# Patient Record
Sex: Female | Born: 1955 | Race: Black or African American | Hispanic: No | Marital: Single
Health system: Southern US, Community
[De-identification: ages and names within clinical notes are randomized; demographics above are authoritative.]

---

## 1999-03-12 ENCOUNTER — Other Ambulatory Visit: Admission: RE | Admit: 1999-03-12 | Discharge: 1999-03-12 | Payer: Self-pay | Admitting: Family Medicine

## 2000-02-08 ENCOUNTER — Encounter: Admission: RE | Admit: 2000-02-08 | Discharge: 2000-02-08 | Payer: Self-pay | Admitting: Family Medicine

## 2000-02-08 ENCOUNTER — Encounter: Payer: Self-pay | Admitting: Family Medicine

## 2000-04-05 ENCOUNTER — Other Ambulatory Visit: Admission: RE | Admit: 2000-04-05 | Discharge: 2000-04-05 | Payer: Self-pay | Admitting: Family Medicine

## 2001-02-08 ENCOUNTER — Encounter: Admission: RE | Admit: 2001-02-08 | Discharge: 2001-02-08 | Payer: Self-pay | Admitting: Family Medicine

## 2001-02-08 ENCOUNTER — Encounter: Payer: Self-pay | Admitting: Family Medicine

## 2001-04-06 ENCOUNTER — Other Ambulatory Visit: Admission: RE | Admit: 2001-04-06 | Discharge: 2001-04-06 | Payer: Self-pay | Admitting: Family Medicine

## 2002-02-13 ENCOUNTER — Encounter: Payer: Self-pay | Admitting: Family Medicine

## 2002-02-13 ENCOUNTER — Encounter: Admission: RE | Admit: 2002-02-13 | Discharge: 2002-02-13 | Payer: Self-pay | Admitting: Family Medicine

## 2003-02-18 ENCOUNTER — Encounter: Admission: RE | Admit: 2003-02-18 | Discharge: 2003-02-18 | Payer: Self-pay | Admitting: Family Medicine

## 2003-02-18 ENCOUNTER — Encounter: Payer: Self-pay | Admitting: Family Medicine

## 2004-03-30 ENCOUNTER — Encounter: Admission: RE | Admit: 2004-03-30 | Discharge: 2004-03-30 | Payer: Self-pay | Admitting: Family Medicine

## 2004-04-26 ENCOUNTER — Other Ambulatory Visit: Admission: RE | Admit: 2004-04-26 | Discharge: 2004-04-26 | Payer: Self-pay | Admitting: Family Medicine

## 2005-01-07 ENCOUNTER — Encounter: Admission: RE | Admit: 2005-01-07 | Discharge: 2005-01-07 | Payer: Self-pay | Admitting: Allergy and Immunology

## 2005-05-02 ENCOUNTER — Other Ambulatory Visit: Admission: RE | Admit: 2005-05-02 | Discharge: 2005-05-02 | Payer: Self-pay | Admitting: Family Medicine

## 2005-05-03 ENCOUNTER — Ambulatory Visit (HOSPITAL_COMMUNITY): Admission: RE | Admit: 2005-05-03 | Discharge: 2005-05-03 | Payer: Self-pay | Admitting: Allergy and Immunology

## 2005-05-03 ENCOUNTER — Encounter: Admission: RE | Admit: 2005-05-03 | Discharge: 2005-05-03 | Payer: Self-pay | Admitting: Family Medicine

## 2005-07-20 ENCOUNTER — Ambulatory Visit: Payer: Self-pay | Admitting: Critical Care Medicine

## 2005-07-25 ENCOUNTER — Ambulatory Visit: Payer: Self-pay | Admitting: Cardiology

## 2005-08-25 ENCOUNTER — Ambulatory Visit: Payer: Self-pay | Admitting: Critical Care Medicine

## 2005-11-06 IMAGING — CR DG CHEST 2V
2 series · 2 of 2 positions shown · non-contrast
Comparison: none

CLINICAL DATA: Cough.  
 DIAGNOSTIC CHEST ? TWO VIEWS:
 No comparison.

[w chest pa]
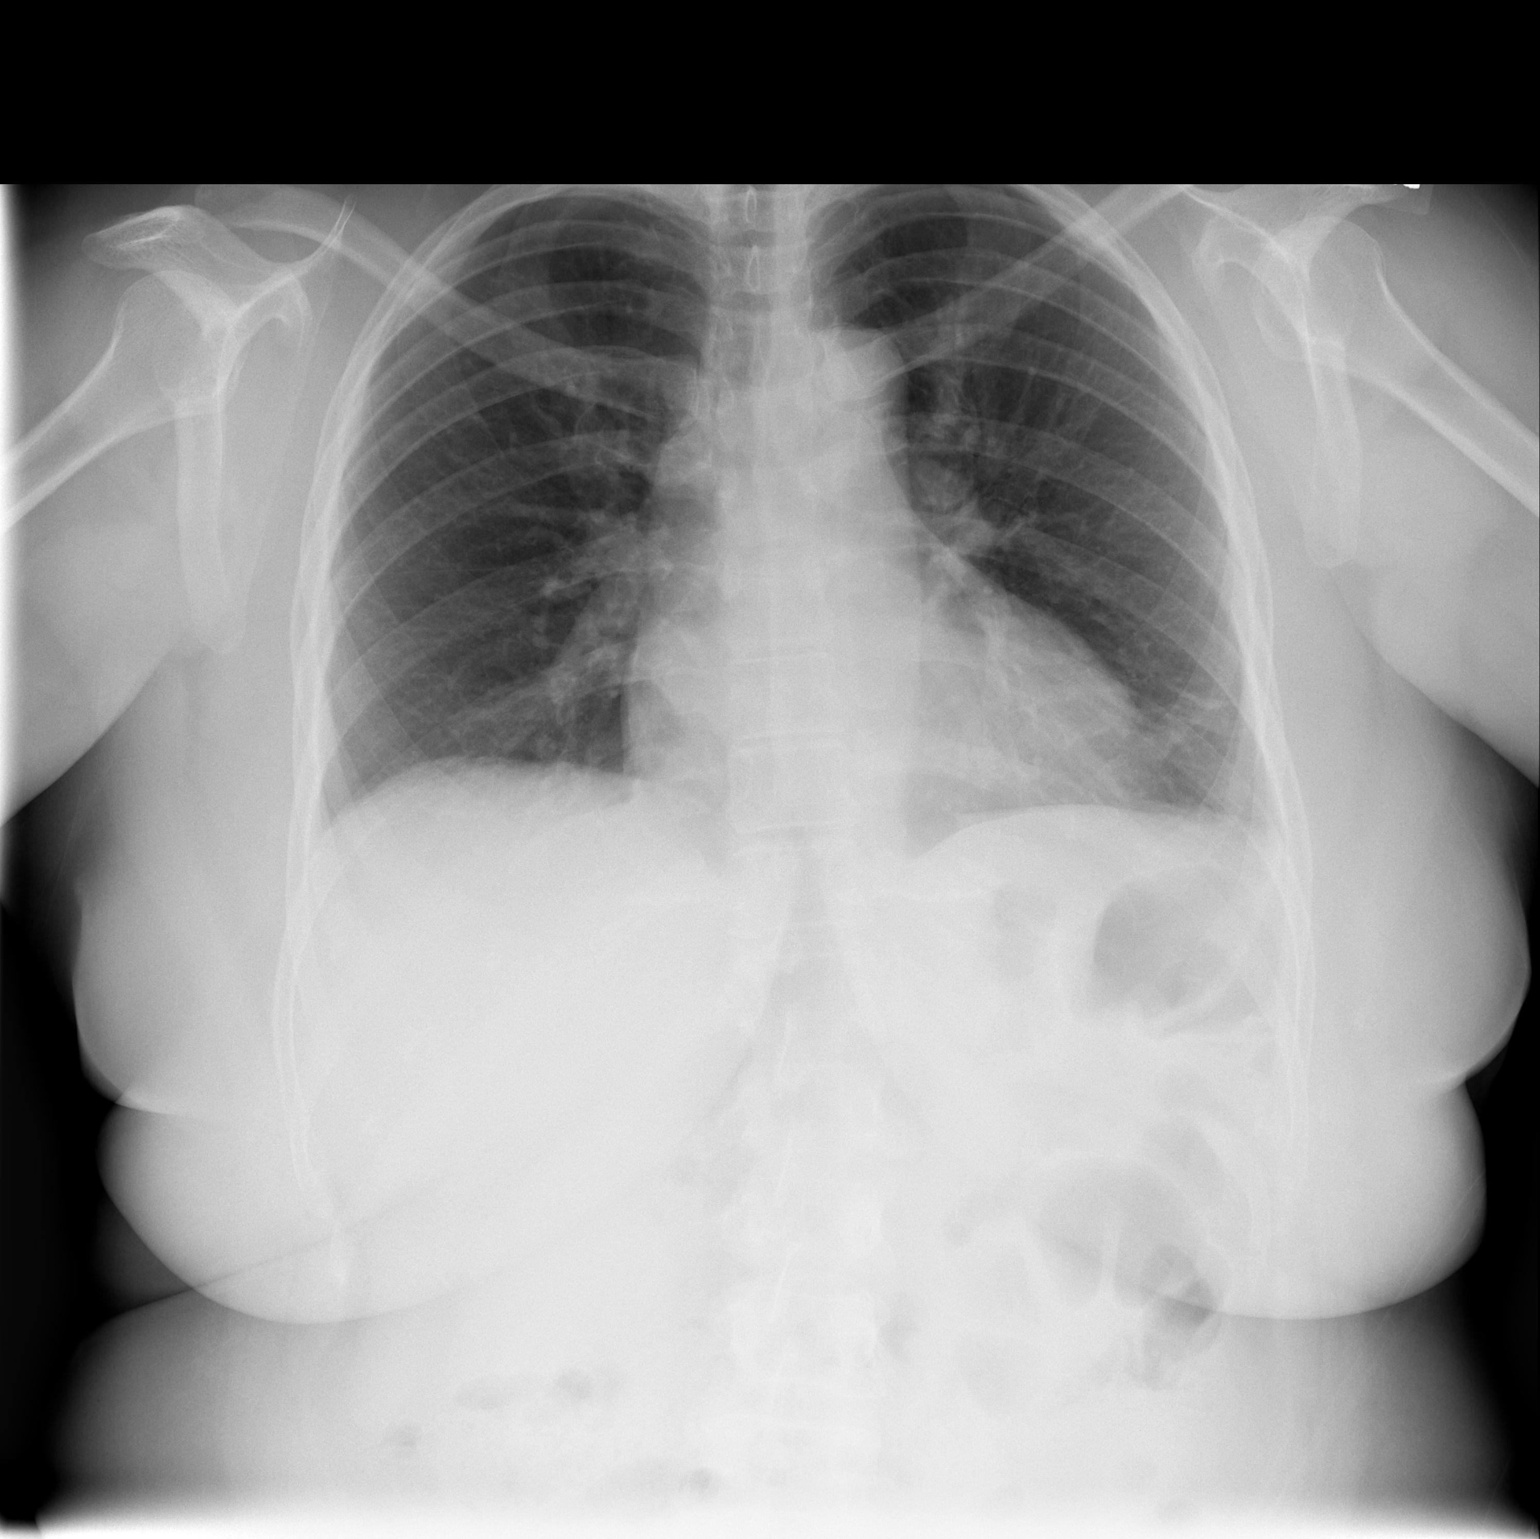

[w chest lat]
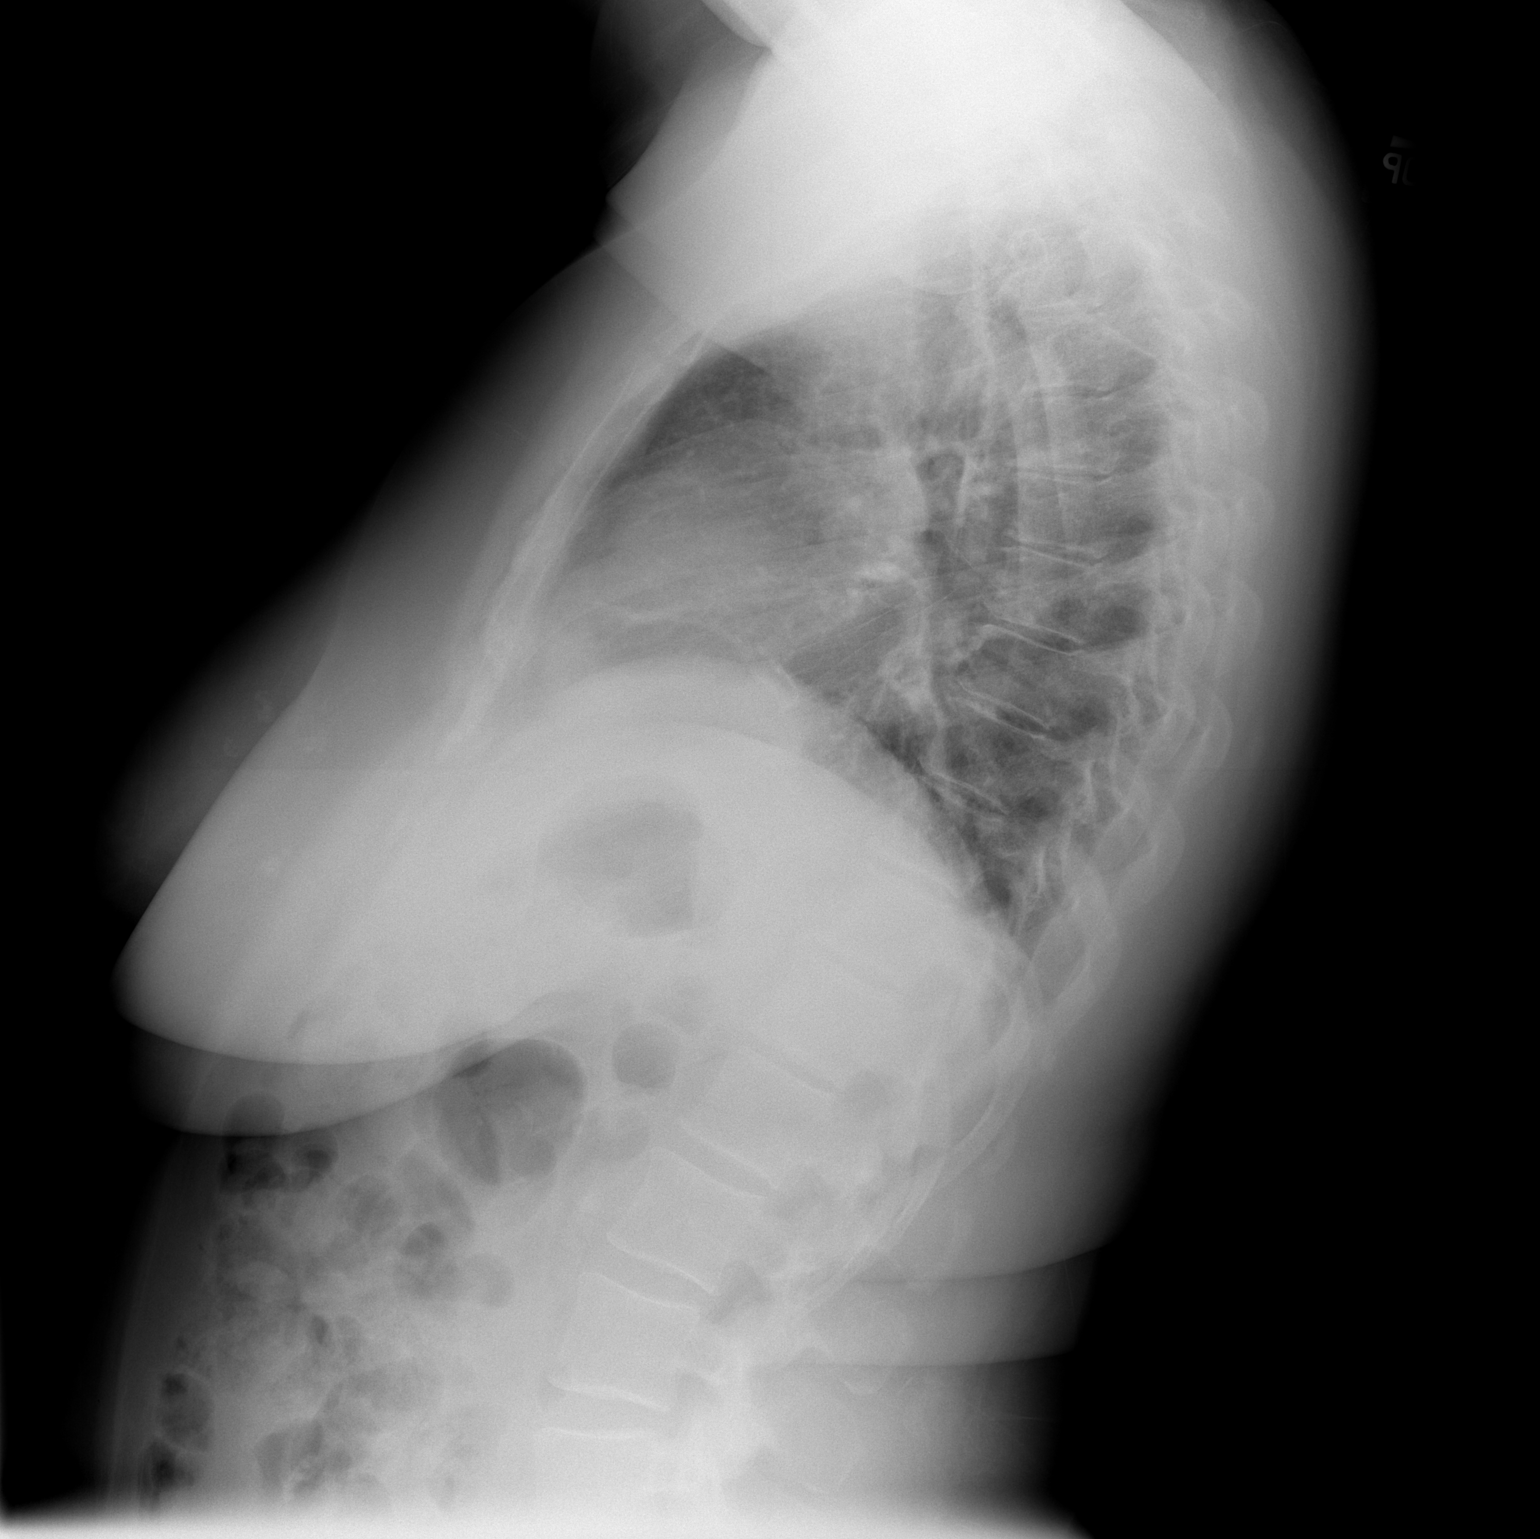

[2 of 2 positions shown; findings below may reference images not displayed]

FINDINGS: Submaximal inspiration is seen with minimal subsegmental atelectasis at the lingula.  The lungs are otherwise clear.  Heart size is upper limits of normal for submaximal inspiration.  The mediastinum, hila, pleura and osseous structures appear normal.
IMPRESSION: 1.  Submaximal inspiration with slight lingular subsegmental atelectasis.
 2.  Otherwise negative.

## 2006-03-22 ENCOUNTER — Ambulatory Visit: Payer: Self-pay | Admitting: Critical Care Medicine

## 2006-05-05 ENCOUNTER — Encounter: Admission: RE | Admit: 2006-05-05 | Discharge: 2006-05-05 | Payer: Self-pay | Admitting: Family Medicine

## 2006-05-24 IMAGING — CT CT PARANASAL SINUSES LIMITED
1 of 2 series · 16 of 25 positions shown, 20 images · non-contrast
Comparison: none

CLINICAL DATA: Cough, drainage, congestion. 
 LIMITED CT OF PARANASAL SINUSES:
TECHNIQUE: Limited coronal CT images were obtained through the paranasal sinuses without intravenous contrast.
 Multidetector helical scans through the paranasal sinuses were performed in the prone coronal position.  There is only mild mucosal thickening in the right maxillary sinus with the remainder of the sinuses well pneumatized.  No air fluid level was seen.   The nasal turbinates are minimally prominent but the nasal airway is patent.   No bony abnormality is seen.

[Series 4: ltd sinus 3.0 h30s · axial · 0.29mm/px · z∈[-106,-8]mm · 16 of 24 slices shown, 20 images]
[im 2/24  brain]
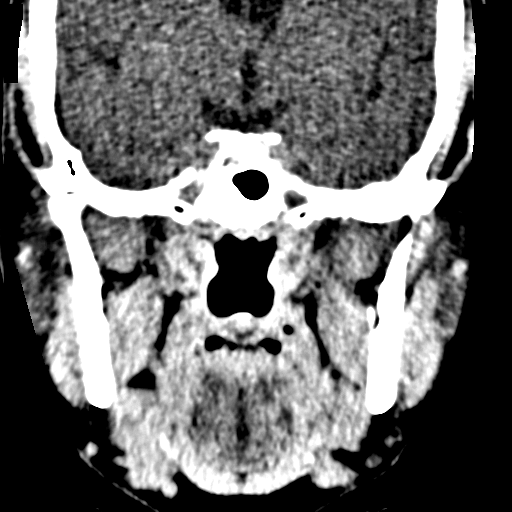
[im 2/24  bone]
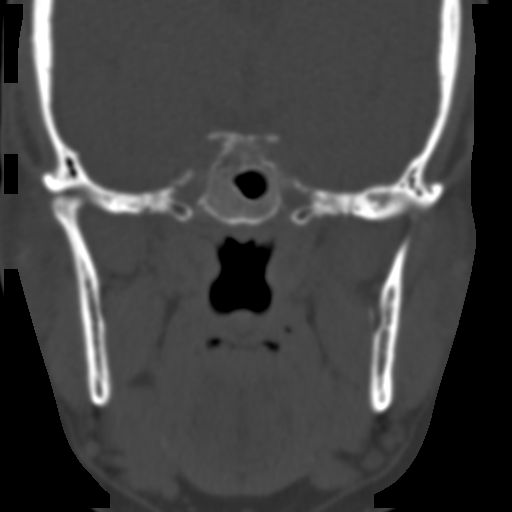
[im 4/24  bone]
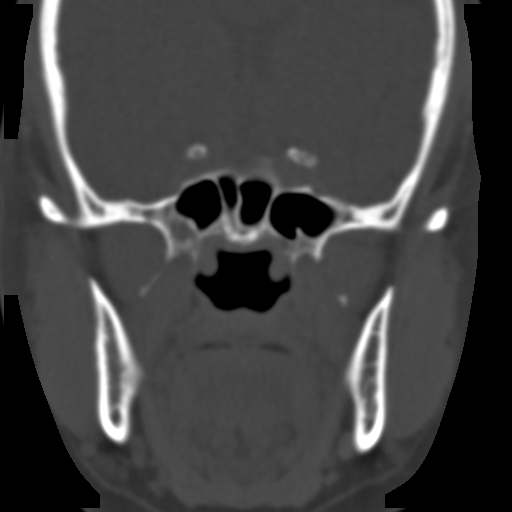
[im 5/24  bone]
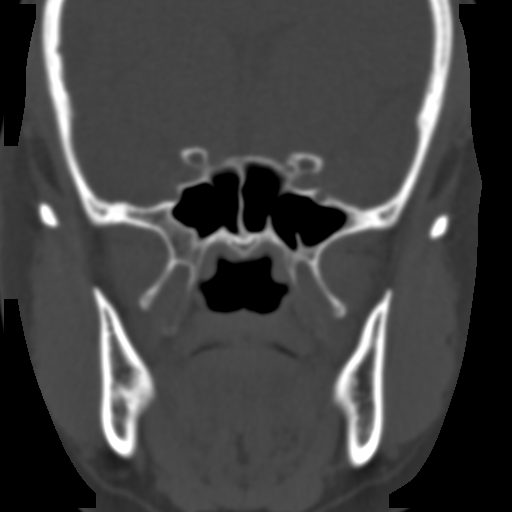
[im 6/24  bone]
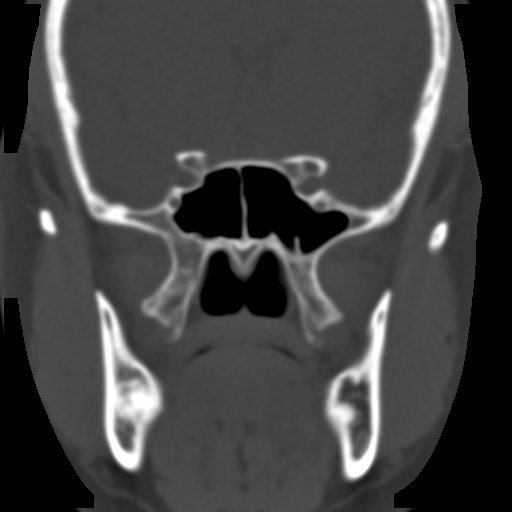
[im 8/24  brain]
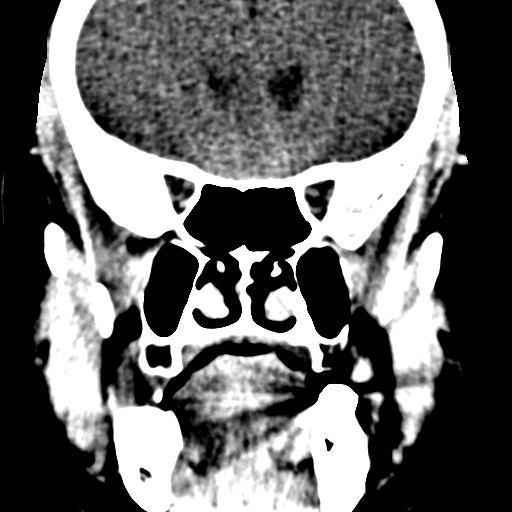
[im 8/24  bone]
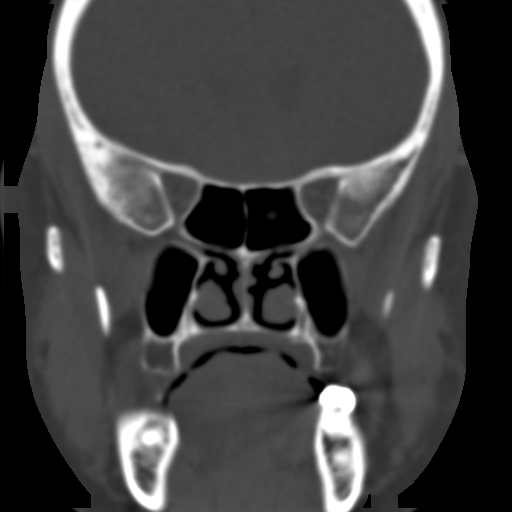
[im 9/24  bone]
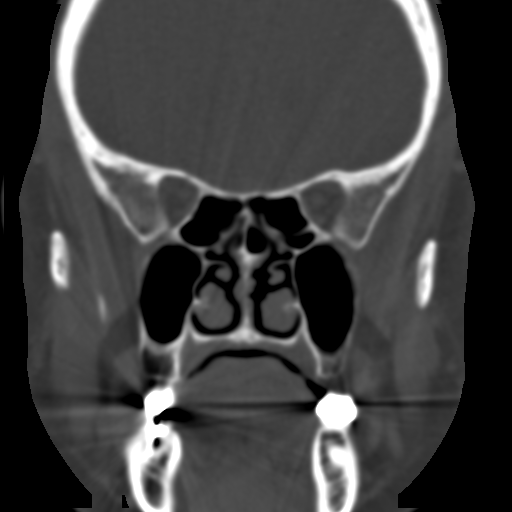
[im 10/24  bone]
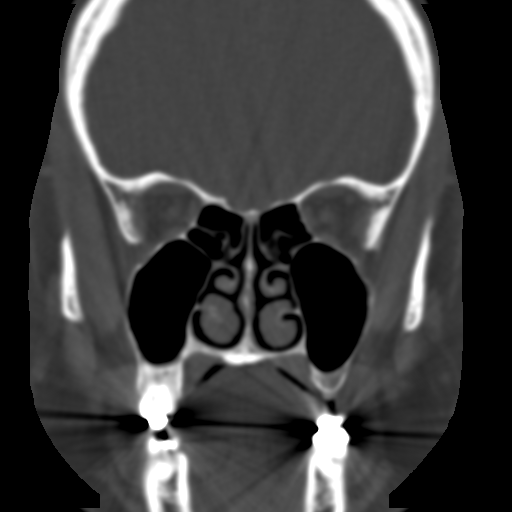
[im 12/24  bone]
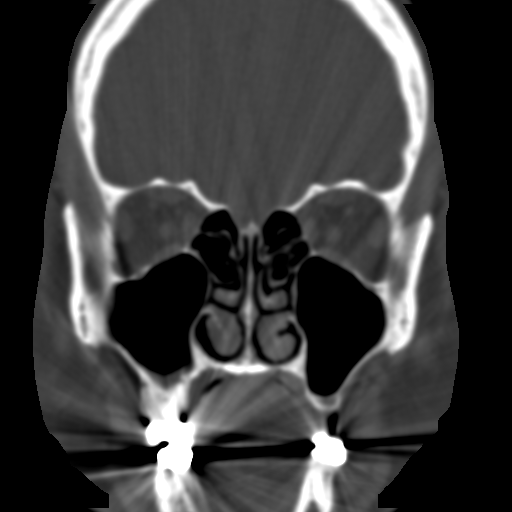
[im 13/24  brain]
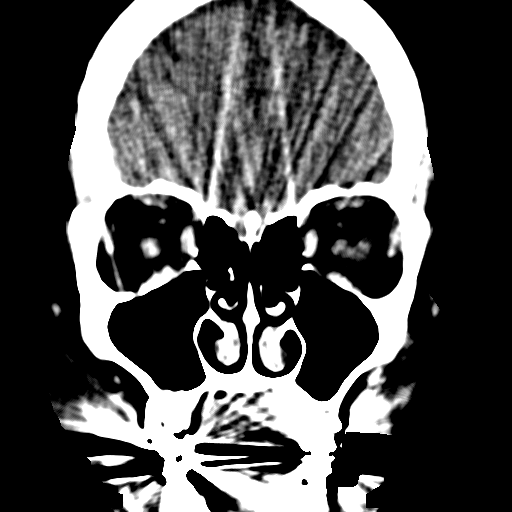
[im 13/24  bone]
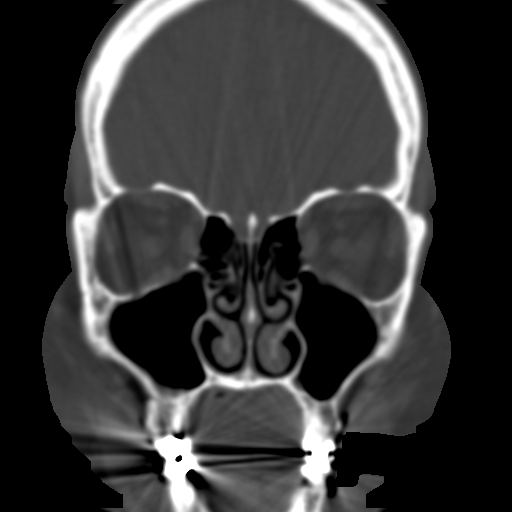
[im 15/24  bone]
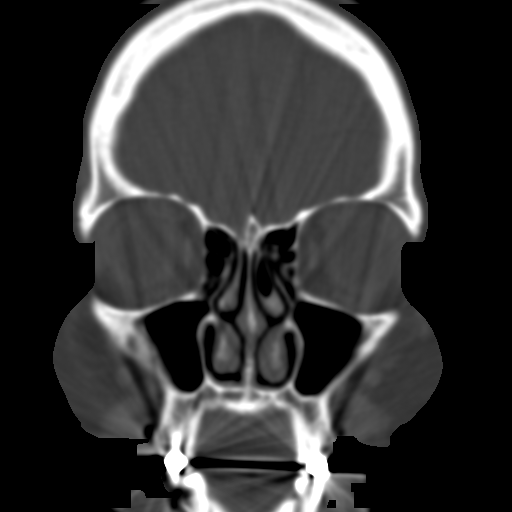
[im 16/24  bone]
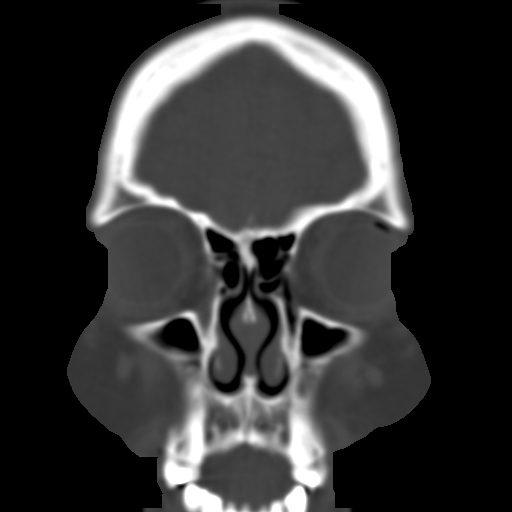
[im 17/24  bone]
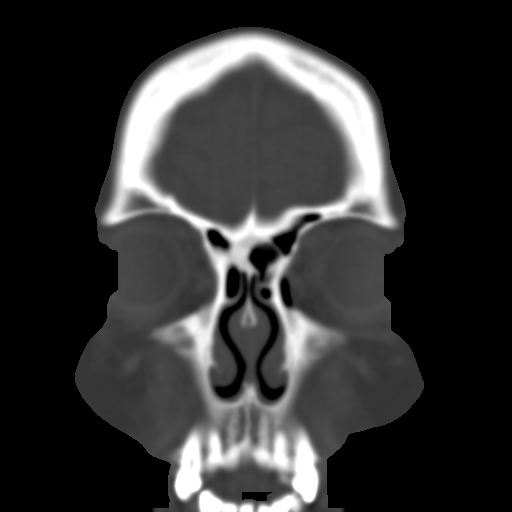
[im 19/24  brain]
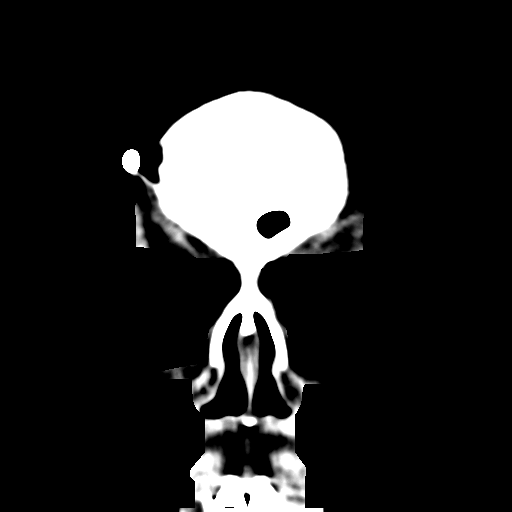
[im 19/24  bone]
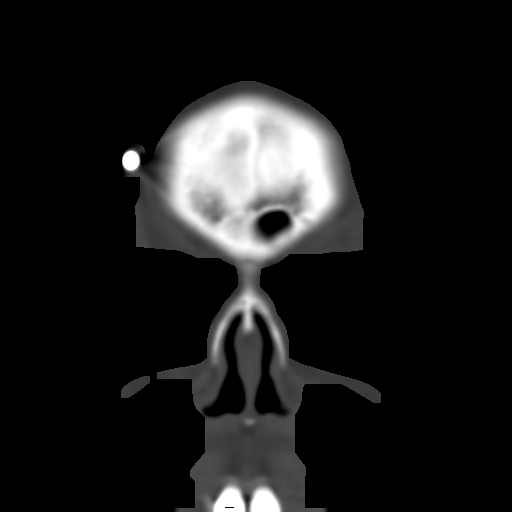
[im 20/24  bone]
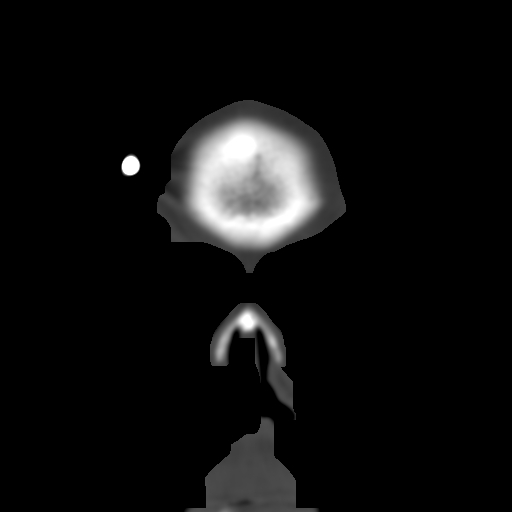
[im 21/24  bone]
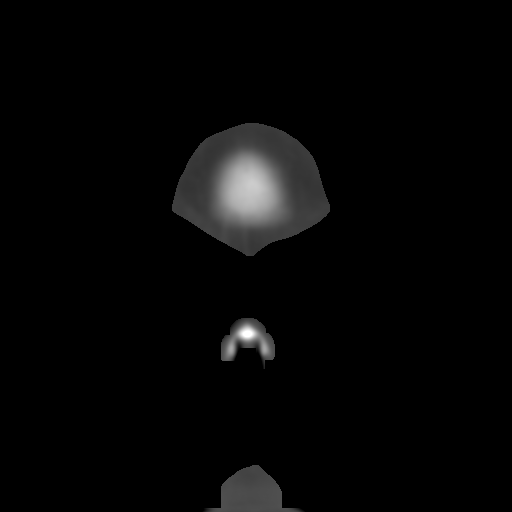
[im 23/24  bone]
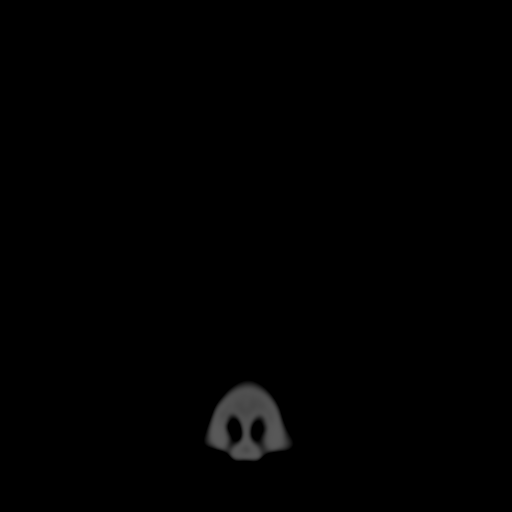

[16 of 25 positions shown; findings below may reference images not displayed]

IMPRESSION: No sinusitis.  Only minimal mucosal thickening in the floor of the right maxillary sinus.

## 2006-07-27 ENCOUNTER — Ambulatory Visit: Payer: Self-pay | Admitting: Critical Care Medicine

## 2006-08-30 ENCOUNTER — Other Ambulatory Visit: Admission: RE | Admit: 2006-08-30 | Discharge: 2006-08-30 | Payer: Self-pay | Admitting: Family Medicine

## 2007-03-27 DIAGNOSIS — J309 Allergic rhinitis, unspecified: Secondary | ICD-10-CM | POA: Insufficient documentation

## 2007-03-27 DIAGNOSIS — J449 Chronic obstructive pulmonary disease, unspecified: Secondary | ICD-10-CM

## 2007-03-27 DIAGNOSIS — J45909 Unspecified asthma, uncomplicated: Secondary | ICD-10-CM | POA: Insufficient documentation

## 2007-03-27 DIAGNOSIS — K219 Gastro-esophageal reflux disease without esophagitis: Secondary | ICD-10-CM

## 2007-05-07 ENCOUNTER — Encounter: Admission: RE | Admit: 2007-05-07 | Discharge: 2007-05-07 | Payer: Self-pay | Admitting: Family Medicine

## 2007-09-17 ENCOUNTER — Other Ambulatory Visit: Admission: RE | Admit: 2007-09-17 | Discharge: 2007-09-17 | Payer: Self-pay | Admitting: Family Medicine

## 2008-05-07 ENCOUNTER — Encounter: Admission: RE | Admit: 2008-05-07 | Discharge: 2008-05-07 | Payer: Self-pay | Admitting: Family Medicine

## 2008-09-30 ENCOUNTER — Emergency Department (HOSPITAL_COMMUNITY): Admission: EM | Admit: 2008-09-30 | Discharge: 2008-09-30 | Payer: Self-pay | Admitting: Emergency Medicine

## 2009-05-12 ENCOUNTER — Encounter: Admission: RE | Admit: 2009-05-12 | Discharge: 2009-05-12 | Payer: Self-pay | Admitting: Family Medicine

## 2010-05-18 ENCOUNTER — Encounter
Admission: RE | Admit: 2010-05-18 | Discharge: 2010-05-18 | Payer: Self-pay | Source: Home / Self Care | Attending: Family Medicine | Admitting: Family Medicine

## 2010-10-08 NOTE — Assessment & Plan Note (Signed)
[facility]                               PULMONARY OFFICE NOTE   NAME:Ostermiller, SADIRA STANDARD                     MRN:          [id number]  DATE:03/22/2006                            DOB:          1956/01/17    Ms. Eckart is a 55 year old African-American female with a history of  asthmatic bronchitis, allergic rhinitis.  Patient states her overall level  of shortness of breath is improved.  She is having less cough, less  wheezing.   She is now maintained on:  1. Prevacid 30 mg a.m.  2. Advair 250/50 1 spray b.i.d.  3. Flonase is 1 spray each nostril daily.   EXAMINATION:  Temperature 98.  Blood pressure 148/100. Pulse 102.  Saturation 97% on room air.  CHEST:  Showed to be clear without evidence of wheeze or rhonchi.  CARDIAC:  Showed a regular rate and rhythm without S3.  Normal S1 and S2.  ABDOMEN:  Soft, nontender.  EXTREMITIES:  Showed no edema or clubbing.  SKIN:  Was clear.   IMPRESSION:  That of asthmatic bronchitis, allergic rhinitis with reflux  disease aggravating the above.   PLAN:  Switch Prevacid to Zegerid for better control at 40 mg daily.  Maintain Advair as currently dosed and return this patient in follow up in 3  months.     Charlcie Cradle Delford Field, MD, [facility]  Electronically Signed    PEW/MedQ  DD: 03/23/2006  DT: 03/24/2006  Job #: [id number]

## 2010-10-08 NOTE — Assessment & Plan Note (Signed)
[facility]                             PULMONARY OFFICE NOTE   NAME:[name], [name]                     MRN:          [id number]  DATE:07/27/2006                            DOB:          1955/08/23    [name] returns today in followup; and is a 55 year old African-  American female with a history of reflux disease, moderate persistent  asthma, and allergic rhinitis.  Overall she is markedly better since she  switched to the Zegerid at 40 mg daily and off Prevacid.  She is also  now on Asmanex 2 sprays daily and off Advair.  She did not require her  albuterol inhaler recently.   PHYSICAL EXAMINATION:  On examination temperature is 98, blood pressure  132/86, pulse 101, saturation 98% on room air.  Chest showed to be  completely clear without evidence of wheeze or rhonchi.  CARDIAC EXAM:  Showed a regular rate and rhythm, without S3.  Normal S1-  S2.  ABDOMEN:  Soft, nontender.  EXTREMITIES:  Showed no edema, clubbing, or venous disease.  SKIN:  Clear.  NEUROLOGIC EXAM:  Intact.  HEENT EXAM:  Showed no jugular venous distention, no lymphadenopathy.  Oropharynx clear.  NECK:  Supple.   IMPRESSION:  Moderate persistent asthma with stable airflow function.  Reflux disease exacerbating above, now improved.   PLAN:  1. Reduce Asmanex to 1 spray daily.  2. Maintain Zegerid 40 mg daily.  3. Albuterol p.r.n.  4,  Refills were given and we will see the patient back in 4 months.     Charlcie Cradle Delford Field, MD, [facility]  Electronically Signed    PEW/MedQ  DD: 07/27/2006  DT: 07/27/2006  Job #: [id number]

## 2011-04-21 ENCOUNTER — Other Ambulatory Visit: Payer: Self-pay | Admitting: Family Medicine

## 2011-04-21 DIAGNOSIS — Z1231 Encounter for screening mammogram for malignant neoplasm of breast: Secondary | ICD-10-CM

## 2011-05-20 ENCOUNTER — Ambulatory Visit
Admission: RE | Admit: 2011-05-20 | Discharge: 2011-05-20 | Disposition: A | Discharge status: Home / Self Care | Payer: BC Managed Care – PPO | Source: Ambulatory Visit | Attending: Family Medicine | Admitting: Family Medicine

## 2011-05-20 DIAGNOSIS — Z1231 Encounter for screening mammogram for malignant neoplasm of breast: Secondary | ICD-10-CM

## 2012-06-05 ENCOUNTER — Other Ambulatory Visit: Payer: Self-pay | Admitting: Family Medicine

## 2012-06-05 DIAGNOSIS — Z1231 Encounter for screening mammogram for malignant neoplasm of breast: Secondary | ICD-10-CM

## 2012-06-07 ENCOUNTER — Ambulatory Visit
Admission: RE | Admit: 2012-06-07 | Discharge: 2012-06-07 | Disposition: A | Discharge status: Home / Self Care | Payer: BC Managed Care – PPO | Source: Ambulatory Visit | Attending: Family Medicine | Admitting: Family Medicine

## 2012-06-07 DIAGNOSIS — Z1231 Encounter for screening mammogram for malignant neoplasm of breast: Secondary | ICD-10-CM

## 2013-07-30 ENCOUNTER — Other Ambulatory Visit: Payer: Self-pay

## 2013-07-30 DIAGNOSIS — Z1231 Encounter for screening mammogram for malignant neoplasm of breast: Secondary | ICD-10-CM

## 2013-08-06 ENCOUNTER — Ambulatory Visit
Admission: RE | Admit: 2013-08-06 | Discharge: 2013-08-06 | Disposition: A | Discharge status: Home / Self Care | Payer: BC Managed Care – PPO | Source: Ambulatory Visit

## 2013-08-06 DIAGNOSIS — Z1231 Encounter for screening mammogram for malignant neoplasm of breast: Secondary | ICD-10-CM

## 2014-07-29 ENCOUNTER — Other Ambulatory Visit: Payer: Self-pay

## 2014-07-29 DIAGNOSIS — Z1231 Encounter for screening mammogram for malignant neoplasm of breast: Secondary | ICD-10-CM

## 2014-08-11 ENCOUNTER — Ambulatory Visit
Admission: RE | Admit: 2014-08-11 | Discharge: 2014-08-11 | Disposition: A | Discharge status: Home / Self Care | Payer: Managed Care, Other (non HMO) | Source: Ambulatory Visit

## 2014-08-11 ENCOUNTER — Encounter (INDEPENDENT_AMBULATORY_CARE_PROVIDER_SITE_OTHER): Payer: Self-pay

## 2014-08-11 DIAGNOSIS — Z1231 Encounter for screening mammogram for malignant neoplasm of breast: Secondary | ICD-10-CM

## 2014-08-13 ENCOUNTER — Other Ambulatory Visit: Payer: Self-pay | Admitting: Family Medicine

## 2014-08-13 DIAGNOSIS — R928 Other abnormal and inconclusive findings on diagnostic imaging of breast: Secondary | ICD-10-CM

## 2014-08-19 ENCOUNTER — Ambulatory Visit
Admission: RE | Admit: 2014-08-19 | Discharge: 2014-08-19 | Disposition: A | Discharge status: Home / Self Care | Payer: Managed Care, Other (non HMO) | Source: Ambulatory Visit | Attending: Family Medicine | Admitting: Family Medicine

## 2014-08-19 DIAGNOSIS — R928 Other abnormal and inconclusive findings on diagnostic imaging of breast: Secondary | ICD-10-CM

## 2021-07-12 ENCOUNTER — Other Ambulatory Visit: Payer: Self-pay

## 2021-07-12 ENCOUNTER — Encounter: Payer: Self-pay | Admitting: Neurology

## 2021-07-12 DIAGNOSIS — R202 Paresthesia of skin: Secondary | ICD-10-CM

## 2021-07-29 ENCOUNTER — Ambulatory Visit: Payer: BC Managed Care – PPO | Admitting: Neurology

## 2021-07-29 ENCOUNTER — Other Ambulatory Visit: Payer: Self-pay

## 2021-07-29 DIAGNOSIS — R202 Paresthesia of skin: Secondary | ICD-10-CM

## 2021-07-29 DIAGNOSIS — G5731 Lesion of lateral popliteal nerve, right lower limb: Secondary | ICD-10-CM

## 2021-07-29 NOTE — Procedures (Signed)
[facility]  ?[address], [address] ? [place], [place] [address] ?Tel: [phone] ?Fax:  [phone] ?Test Date:  07/29/2021 ? ?Patient: [name] DOB: 04-25-1956 Physician: Narda Amber, DO  ?Sex: Female Height: 5\' 2"  Ref Phys: Marchia Bond, MD  ?ID#: [id number]   Technician:   ? ?Patient Complaints: ?This is a 66 year old female referred for evaluation of right foot drop. ? ?NCV & EMG Findings: ?Extensive electrodiagnostic testing of the right lower extremity shows:  ?Right superficial peroneal sensory response is borderline normal.  Right sural sensory response is within normal limits. ?Right peroneal motor response at the extensor digitorum brevis is absent.  Right peroneal motor response at the tibialis anterior shows reduced amplitude (1.6 mV) and decreased conduction velocity (Poplit-Fib Head, 33 m/s).  Right tibial motor responses within normal limits. ?Despite maximal activation, no motor unit recruitment is seen in the tibialis anterior, extensor hallucis longus, and fibularis longus muscles.  Active fibrillation potentials are seen in these muscles. ? 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  Overall, these findings are severe in degree electrically. ?There is no evidence of a lumbosacral radiculopathy or sensorimotor polyneuropathy affecting the right lower extremity. ? ? ?___________________________ ?Narda Amber, DO ? ? ? ?Nerve Conduction Studies ?Anti Sensory Summary Table ? ? Stim Site NR Peak (ms) Norm Peak (ms) P-T Amp (?V) Norm P-T Amp  ?Right Sup Peroneal Anti Sensory (Ant Lat Mall)  32?C  ?12 cm    2.9 <4.6 3.0 >3  ?Right Sural Anti Sensory (Lat Mall)  32?C  ?Calf    2.2 <4.6 12.8 >3  ? ?Motor Summary Table ? ? Stim Site NR Onset (ms) Norm Onset (ms) O-P Amp (mV) Norm O-P Amp Site1 Site2 Delta-0 (ms) Dist (cm) Vel (m/s) Norm Vel (m/s)  ?Right Peroneal Motor (Ext Dig Brev)  32?C  ?Ankle NR  <6.0   >2.5 B Fib Ankle  0.0  >40  ?B Fib NR     Poplt B Fib  0.0  >40  ?Poplt NR            ?Right Peroneal TA Motor (Tib Ant)  32?C  ?Fib Head    2.7 <4.5 1.6 >3 Poplit Fib Head 2.1 7.0 33 >40  ?Poplit    4.8  1.3         ?Right Tibial Motor (Abd [place] Crane Brev)  32?C  ?Ankle    2.9 <6.0 5.3 >4 Knee Ankle 8.0 39.0 49 >40  ?Knee    10.9  4.4         ? ?H Reflex Studies ? ? NR H-Lat (ms) Lat Norm (ms) L-R H-Lat (ms)  ?Right Tibial (Gastroc)  32?C  ?   30.34 <35   ? ?EMG ? ? Side Muscle Ins Act Fibs Psw Fasc Number Recrt Dur Dur. Amp Amp. Poly Poly. Comment  ?Right AntTibialis Nml 2+ Nml Nml NE None - - - - - - N/A  ?Right Gastroc Nml Nml Nml Nml Nml Nml Nml Nml Nml Nml Nml Nml N/A  ?Right Flex Dig Long Nml Nml Nml Nml Nml Nml Nml Nml Nml Nml Nml Nml N/A  ?Right ExtHallLong Nml 2+ Nml Nml NE Rapid - - - - - - N/A  ?Right Fibularis Long Nml 1+ Nml Nml NE Rapid - - - - - - N/A  ?Right RectFemoris Nml Nml Nml Nml Nml Nml Nml Nml Nml Nml Nml Nml N/A  ?Right BicepsFemS Nml Nml Nml  Nml Nml Nml Nml Nml Nml Nml Nml Nml N/A  ?Right GluteusMed Nml Nml Nml Nml Nml Nml Nml Nml Nml Nml Nml Nml N/A  ? ? ? ? ?Waveforms: ?    ? ?    ? ? ?

## 2021-08-12 ENCOUNTER — Encounter: Payer: Managed Care, Other (non HMO) | Admitting: Neurology
